# Patient Record
Sex: Male | Born: 1965 | Race: White | Hispanic: No | Marital: Single
Health system: Southern US, Community
[De-identification: ages and names within clinical notes are randomized; demographics above are authoritative.]

## PROBLEM LIST (undated history)

## (undated) HISTORY — PX: OTHER SURGICAL HISTORY: SHX169

## (undated) HISTORY — PX: CHOLECYSTECTOMY: SHX55

---

## 2015-11-20 ENCOUNTER — Other Ambulatory Visit (INDEPENDENT_AMBULATORY_CARE_PROVIDER_SITE_OTHER): Payer: Self-pay | Admitting: Internal Medicine

## 2015-11-20 DIAGNOSIS — K8050 Calculus of bile duct without cholangitis or cholecystitis without obstruction: Secondary | ICD-10-CM

## 2015-11-21 ENCOUNTER — Encounter (HOSPITAL_COMMUNITY)
Admission: RE | Disposition: A | Discharge status: Home / Self Care | Payer: Self-pay | Source: Ambulatory Visit | Attending: Internal Medicine

## 2015-11-21 ENCOUNTER — Ambulatory Visit (HOSPITAL_COMMUNITY): Payer: No Typology Code available for payment source | Admitting: Anesthesiology

## 2015-11-21 ENCOUNTER — Ambulatory Visit (HOSPITAL_COMMUNITY)
Admission: RE | Admit: 2015-11-21 | Discharge: 2015-11-21 | Disposition: A | Discharge status: Home / Self Care | Payer: No Typology Code available for payment source | Source: Ambulatory Visit | Attending: Internal Medicine | Admitting: Internal Medicine

## 2015-11-21 ENCOUNTER — Ambulatory Visit (HOSPITAL_COMMUNITY): Payer: No Typology Code available for payment source

## 2015-11-21 ENCOUNTER — Encounter (HOSPITAL_COMMUNITY): Payer: Self-pay | Admitting: *Deleted

## 2015-11-21 DIAGNOSIS — Z7982 Long term (current) use of aspirin: Secondary | ICD-10-CM | POA: Insufficient documentation

## 2015-11-21 DIAGNOSIS — K838 Other specified diseases of biliary tract: Secondary | ICD-10-CM | POA: Diagnosis not present

## 2015-11-21 DIAGNOSIS — Z9049 Acquired absence of other specified parts of digestive tract: Secondary | ICD-10-CM | POA: Diagnosis not present

## 2015-11-21 DIAGNOSIS — Z7951 Long term (current) use of inhaled steroids: Secondary | ICD-10-CM | POA: Insufficient documentation

## 2015-11-21 DIAGNOSIS — K8050 Calculus of bile duct without cholangitis or cholecystitis without obstruction: Secondary | ICD-10-CM | POA: Diagnosis not present

## 2015-11-21 HISTORY — PX: REMOVAL OF STONES: SHX5545

## 2015-11-21 HISTORY — PX: SPHINCTEROTOMY: SHX5544

## 2015-11-21 HISTORY — PX: ERCP: SHX5425

## 2015-11-21 LAB — HEPATIC FUNCTION PANEL
ALT: 26 U/L (ref 17–63)
AST: 23 U/L (ref 15–41)
Albumin: 3.1 g/dL — ABNORMAL LOW (ref 3.5–5.0)
Alkaline Phosphatase: 42 U/L (ref 38–126)
BILIRUBIN DIRECT: 0.2 mg/dL (ref 0.1–0.5)
Indirect Bilirubin: 0.7 mg/dL (ref 0.3–0.9)
Total Bilirubin: 0.9 mg/dL (ref 0.3–1.2)
Total Protein: 5.9 g/dL — ABNORMAL LOW (ref 6.5–8.1)

## 2015-11-21 SURGERY — ERCP, WITH INTERVENTION IF INDICATED
Anesthesia: General

## 2015-11-21 MED ORDER — SODIUM CHLORIDE 0.9 % IV SOLN
INTRAVENOUS | Status: AC
  Filled 2015-11-21: qty 50

## 2015-11-21 MED ORDER — FENTANYL CITRATE (PF) 100 MCG/2ML IJ SOLN: 25.0000 ug | INTRAMUSCULAR | Status: DC | PRN

## 2015-11-21 MED ORDER — ROCURONIUM BROMIDE 100 MG/10ML IV SOLN
INTRAVENOUS | Status: DC | PRN
  Administered 2015-11-21 (×2): 10 mg via INTRAVENOUS

## 2015-11-21 MED ORDER — GLUCAGON HCL RDNA (DIAGNOSTIC) 1 MG IJ SOLR
INTRAMUSCULAR | Status: AC
  Administered 2015-11-21 (×2): 0.25 mg via INTRAVENOUS
  Filled 2015-11-21: qty 2

## 2015-11-21 MED ORDER — SUCCINYLCHOLINE CHLORIDE 20 MG/ML IJ SOLN
INTRAMUSCULAR | Status: AC
  Filled 2015-11-21: qty 2

## 2015-11-21 MED ORDER — LEVOFLOXACIN IN D5W 500 MG/100ML IV SOLN
500.0000 mg | Freq: Once | INTRAVENOUS | Status: AC
  Administered 2015-11-21: 500 mg via INTRAVENOUS

## 2015-11-21 MED ORDER — SUCCINYLCHOLINE CHLORIDE 20 MG/ML IJ SOLN
INTRAMUSCULAR | Status: DC | PRN
  Administered 2015-11-21: 180 mg via INTRAVENOUS

## 2015-11-21 MED ORDER — GLYCOPYRROLATE 0.2 MG/ML IJ SOLN
INTRAMUSCULAR | Status: AC
  Filled 2015-11-21: qty 1

## 2015-11-21 MED ORDER — FENTANYL CITRATE (PF) 100 MCG/2ML IJ SOLN
INTRAMUSCULAR | Status: DC | PRN
  Administered 2015-11-21 (×2): 50 ug via INTRAVENOUS

## 2015-11-21 MED ORDER — PROPOFOL 10 MG/ML IV BOLUS
INTRAVENOUS | Status: AC
  Filled 2015-11-21: qty 20

## 2015-11-21 MED ORDER — MIDAZOLAM HCL 2 MG/2ML IJ SOLN
INTRAMUSCULAR | Status: AC
  Filled 2015-11-21: qty 2

## 2015-11-21 MED ORDER — ONDANSETRON HCL 4 MG/2ML IJ SOLN: 4.0000 mg | Freq: Once | INTRAMUSCULAR | Status: DC | PRN

## 2015-11-21 MED ORDER — STERILE WATER FOR IRRIGATION IR SOLN
Status: DC | PRN
  Administered 2015-11-21: 1000 mL

## 2015-11-21 MED ORDER — FENTANYL CITRATE (PF) 100 MCG/2ML IJ SOLN
INTRAMUSCULAR | Status: AC
  Filled 2015-11-21: qty 2

## 2015-11-21 MED ORDER — CEFAZOLIN SODIUM-DEXTROSE 2-4 GM/100ML-% IV SOLN
INTRAVENOUS | Status: AC
  Filled 2015-11-21: qty 100

## 2015-11-21 MED ORDER — NEOSTIGMINE METHYLSULFATE 10 MG/10ML IV SOLN
INTRAVENOUS | Status: DC | PRN
  Administered 2015-11-21: 4 mg via INTRAVENOUS

## 2015-11-21 MED ORDER — ROCURONIUM BROMIDE 50 MG/5ML IV SOLN
INTRAVENOUS | Status: AC
  Filled 2015-11-21: qty 1

## 2015-11-21 MED ORDER — PROPOFOL 10 MG/ML IV BOLUS
INTRAVENOUS | Status: DC | PRN
  Administered 2015-11-21: 180 mg via INTRAVENOUS

## 2015-11-21 MED ORDER — GLYCOPYRROLATE 0.2 MG/ML IJ SOLN
INTRAMUSCULAR | Status: DC | PRN
  Administered 2015-11-21: 0.6 mg via INTRAVENOUS

## 2015-11-21 MED ORDER — CEFAZOLIN SODIUM-DEXTROSE 2-4 GM/100ML-% IV SOLN
2.0000 g | INTRAVENOUS | Status: DC
  Filled 2015-11-21: qty 100

## 2015-11-21 MED ORDER — LIDOCAINE HCL (CARDIAC) 20 MG/ML IV SOLN
INTRAVENOUS | Status: DC | PRN
  Administered 2015-11-21: 50 mg via INTRAVENOUS

## 2015-11-21 MED ORDER — SODIUM CHLORIDE 0.9 % IV SOLN
INTRAVENOUS | Status: DC | PRN
  Administered 2015-11-21: 100 mL

## 2015-11-21 MED ORDER — LACTATED RINGERS IV SOLN
INTRAVENOUS | Status: DC
  Administered 2015-11-21: 13:00:00 via INTRAVENOUS
  Administered 2015-11-21: 1000 mL via INTRAVENOUS

## 2015-11-21 MED ORDER — MIDAZOLAM HCL 2 MG/2ML IJ SOLN
1.0000 mg | INTRAMUSCULAR | Status: DC | PRN
  Administered 2015-11-21 (×2): 2 mg via INTRAVENOUS
  Filled 2015-11-21: qty 2

## 2015-11-21 MED ORDER — GLYCOPYRROLATE 0.2 MG/ML IJ SOLN
0.2000 mg | Freq: Once | INTRAMUSCULAR | Status: AC
  Administered 2015-11-21: 0.2 mg via INTRAVENOUS

## 2015-11-21 MED ORDER — GLYCOPYRROLATE 0.2 MG/ML IJ SOLN
INTRAMUSCULAR | Status: AC
  Filled 2015-11-21: qty 5

## 2015-11-21 MED ORDER — LEVOFLOXACIN IN D5W 500 MG/100ML IV SOLN
INTRAVENOUS | Status: AC
  Filled 2015-11-21: qty 100

## 2015-11-21 MED ORDER — ONDANSETRON HCL 4 MG/2ML IJ SOLN
INTRAMUSCULAR | Status: AC
  Filled 2015-11-21: qty 2

## 2015-11-21 MED ORDER — ONDANSETRON HCL 4 MG/2ML IJ SOLN
4.0000 mg | Freq: Once | INTRAMUSCULAR | Status: AC
  Administered 2015-11-21: 4 mg via INTRAVENOUS

## 2015-11-21 NOTE — Progress Notes (Signed)
Blood drawn and sent to lab for results. 

## 2015-11-21 NOTE — Progress Notes (Signed)
Dr Karilyn Cotaehman notified of pt allergy to  PCN. Order changed to levaquin.

## 2015-11-21 NOTE — Anesthesia Postprocedure Evaluation (Signed)
Anesthesia Post Note  Patient: Barry Robinson  Procedure(s) Performed: Procedure(s) (LRB): ENDOSCOPIC RETROGRADE CHOLANGIOPANCREATOGRAPHY (ERCP) (N/A) BALLOON AND BASKET EXTRACTION OF STONES (N/A) SPHINCTEROTOMY (N/A)  Patient location during evaluation: PACU Anesthesia Type: General Level of consciousness: awake and alert Pain management: pain level controlled Vital Signs Assessment: vitals unstable Respiratory status: spontaneous breathing Cardiovascular status: stable Anesthetic complications: no    Last Vitals:  Filed Vitals:   11/21/15 1240 11/21/15 1245  BP: 143/56 130/63  Pulse:  90  Temp: 36.7 C   Resp: 14 13    Last Pain: There were no vitals filed for this visit.               Minerva AreolaYATES,Shatasia Cutshaw

## 2015-11-21 NOTE — Transfer of Care (Signed)
Immediate Anesthesia Transfer of Care Note  Patient: Barry LunaEdward Robinson  Procedure(s) Performed: Procedure(s): ENDOSCOPIC RETROGRADE CHOLANGIOPANCREATOGRAPHY (ERCP) (N/A) BALLOON AND BASKET EXTRACTION OF STONES (N/A) SPHINCTEROTOMY (N/A)  Patient Location: PACU  Anesthesia Type:General  Level of Consciousness: awake and patient cooperative  Airway & Oxygen Therapy: Patient Spontanous Breathing  Post-op Assessment: Report given to RN, Post -op Vital signs reviewed and stable and Patient moving all extremities  Post vital signs: Reviewed and stable  Last Vitals:  Filed Vitals:   11/21/15 1115 11/21/15 1120  BP: 113/56 119/59  Temp:    Resp: 17 13    Last Pain: There were no vitals filed for this visit.       Complications: No apparent anesthesia complications

## 2015-11-21 NOTE — Anesthesia Procedure Notes (Signed)
Procedure Name: Intubation Date/Time: 11/21/2015 11:35 AM Performed by: Pernell DupreADAMS, Barry Dugger A Pre-anesthesia Checklist: Patient identified, Patient being monitored, Timeout performed, Emergency Drugs available and Suction available Patient Re-evaluated:Patient Re-evaluated prior to inductionOxygen Delivery Method: Circle System Utilized Preoxygenation: Pre-oxygenation with 100% oxygen Intubation Type: IV induction, Rapid sequence and Cricoid Pressure applied Laryngoscope Size: 3 and Miller Grade View: Grade I Tube type: Oral Tube size: 7.0 mm Number of attempts: 1 Airway Equipment and Method: Stylet Placement Confirmation: ETT inserted through vocal cords under direct vision,  positive ETCO2 and breath sounds checked- equal and bilateral Secured at: 21 cm Tube secured with: Tape Dental Injury: Teeth and Oropharynx as per pre-operative assessment

## 2015-11-21 NOTE — Anesthesia Preprocedure Evaluation (Signed)
Anesthesia Evaluation  Patient identified by MRN, date of birth, ID band Patient awake    Reviewed: Allergy & Precautions, NPO status , Patient's Chart, lab work & pertinent test results  Airway Mallampati: I  TM Distance: >3 FB     Dental  (+) Teeth Intact, Dental Advisory Given   Pulmonary neg pulmonary ROS,    breath sounds clear to auscultation       Cardiovascular negative cardio ROS   Rhythm:Regular Rate:Normal     Neuro/Psych    GI/Hepatic negative GI ROS,   Endo/Other  Morbid obesity  Renal/GU      Musculoskeletal   Abdominal   Peds  Hematology   Anesthesia Other Findings   Reproductive/Obstetrics                             Anesthesia Physical Anesthesia Plan  ASA: II  Anesthesia Plan: General   Post-op Pain Management:    Induction: Intravenous, Rapid sequence and Cricoid pressure planned  Airway Management Planned: Oral ETT  Additional Equipment:   Intra-op Plan:   Post-operative Plan: Extubation in OR  Informed Consent: I have reviewed the patients History and Physical, chart, labs and discussed the procedure including the risks, benefits and alternatives for the proposed anesthesia with the patient or authorized representative who has indicated his/her understanding and acceptance.     Plan Discussed with:   Anesthesia Plan Comments:         Anesthesia Quick Evaluation

## 2015-11-21 NOTE — H&P (Signed)
Barry Robinson is an 50 y.o. male.   Chief Complaint: Patient is here for ERCP with stone extraction HPI: Asian is 50 year old Caucasian male who underwent laparoscopic cholecystectomy by Dr. Marcha Soldersathey 2 days ago. JP drain was left in place. There was concern for choledocholithiasis. Patient underwent MRCP S phase revealed 3 stones in CBD. Patient is therefore referred for therapeutic ERCP. Patient denies fever chills nausea or vomiting. Soreness around laparoscopy wound site and gas pain. He had diarrhea yesterday. Denies melena or rectal bleeding. Last aspirin dose was 4 days ago.  No past medical history on file.  Past Surgical History  Procedure Laterality Date  . Cholecystectomy    . Lithotripsy x4 Bilateral last one 04/2014    No family history on file. Social History:  has no tobacco, alcohol, and drug history on file.  Allergies:  Allergies  Allergen Reactions  . Penicillins Hives    Has patient had a PCN reaction causing immediate rash, facial/tongue/throat swelling, SOB or lightheadedness with hypotension: Nono Has patient had a PCN reaction causing severe rash involving mucus membranes or skin necrosis: Nono just hives Has patient had a PCN reaction that required hospitalization Nono Has patient had a PCN reaction occurring within the last 10 years: Barry DibblesYesyes If all of the above answers are "NO", then may proceed with Ce  . Rocephin [Ceftriaxone] Hives    Medications Prior to Admission  Medication Sig Dispense Refill  . aspirin 325 MG tablet Take 325 mg by mouth daily.    . cetirizine (ZYRTEC) 10 MG tablet Take 10 mg by mouth daily.    Marland Kitchen. CRANBERRY PO Take 1 tablet by mouth daily.    . fluticasone (FLONASE) 50 MCG/ACT nasal spray Place into both nostrils daily.    . Multiple Vitamin (MULTIVITAMIN) tablet Take 1 tablet by mouth daily.    . Omega-3 Fatty Acids (FISH OIL) 1200 MG CAPS Take 2 capsules by mouth daily.    . Probiotic Product (ALIGN PO) Take 1 capsule by mouth  daily.    . vitamin A 7500 UNIT capsule Take 7,500 Units by mouth daily.    . vitamin C (ASCORBIC ACID) 500 MG tablet Take 500 mg by mouth daily.      Results for orders placed or performed during the hospital encounter of 11/21/15 (from the past 48 hour(s))  Hepatic function panel     Status: Abnormal   Collection Time: 11/21/15  8:15 AM  Result Value Ref Range   Total Protein 5.9 (L) 6.5 - 8.1 g/dL   Albumin 3.1 (L) 3.5 - 5.0 g/dL   AST 23 15 - 41 U/L   ALT 26 17 - 63 U/L   Alkaline Phosphatase 42 38 - 126 U/L   Total Bilirubin 0.9 0.3 - 1.2 mg/dL   Bilirubin, Direct 0.2 0.1 - 0.5 mg/dL   Indirect Bilirubin 0.7 0.3 - 0.9 mg/dL   No results found.  ROS  Blood pressure 125/50, temperature 98.8 F (37.1 C), resp. rate 16, height 5\' 9"  (1.753 m), weight 295 lb (133.811 kg), SpO2 98 %. Physical Exam  Constitutional:  Pleasant well-developed obese Caucasian male in NAD.  HENT:  Mouth/Throat: Oropharynx is clear and moist.  Eyes: Conjunctivae are normal. No scleral icterus.  Neck: No thyromegaly present.  Cardiovascular: Normal rate, regular rhythm and normal heart sounds.   No murmur heard. Respiratory: Effort normal and breath sounds normal.  GI:  Abdomen is full with laparoscopies wounds covered with dressing. JP drain in place with scant amount  of serous fluid in the container. Abdomen is soft and nontender without organomegaly or masses.  Lymphadenopathy:    He has no cervical adenopathy.     Assessment/Plan Choledocholithiasis and the patient was status post laparoscopic cholecystectomy 2 days ago for acute cholecystitis. Transaminases are normal. ERCP with biliary sphincterotomy and stone extraction. Procedure and risks reviewed with the patient and is agreeable. Patient will be returning to [facility] once ERCP performed.  Malissa Hippo, MD 11/21/2015, 10:19 AM

## 2015-11-21 NOTE — Op Note (Signed)
[facility] Patient Name: Barry Robinson Procedure Date: 11/21/2015 11:28 AM MRN: [id number] Date of Birth: 03/04/1966 Attending MD: Lionel December , MD CSN: [id number] Age: 50 Admit Type: Outpatient Procedure:                ERCP Indications:              Bile duct stone(s), For therapy of bile duct                            stone(s) Providers:                Lionel December, MD, Edrick Kins, RN, Birder Robson, Technician Referring MD:             Alice Reichert, MD Medicines:                General Anesthesia Complications:            No immediate complications. Estimated Blood Loss:     Estimated blood loss: none. Procedure:                Pre-Anesthesia Assessment:                           - Prior to the procedure, a History and Physical                            was performed, and patient medications and                            allergies were reviewed. The patient's tolerance of                            previous anesthesia was also reviewed. The risks                            and benefits of the procedure and the sedation                            options and risks were discussed with the patient.                            All questions were answered, and informed consent                            was obtained. Prior Anticoagulants: The patient                            last took aspirin 4 days prior to the procedure.                            ASA Grade Assessment: II - A patient with mild  systemic disease. After reviewing the risks and                            benefits, the patient was deemed in satisfactory                            condition to undergo the procedure.                           After obtaining informed consent, the scope was                            passed under direct vision. Throughout the                            procedure, the patient's blood pressure, pulse, and                       oxygen saturations were monitored continuously. The                            [id number] ([id number]) was introduced through the                            mouth, and used to inject contrast into and used to                            inject contrast into the bile duct. The ERCP was                            accomplished without difficulty. The patient                            tolerated the procedure well. Scope In: 11:55:12 AM Scope Out: 12:24:16 PM Total Procedure Duration: 0 hours 29 minutes 4 seconds  Findings:      The scout film was normal. The esophagus was successfully intubated       under direct vision. The scope was advanced to a normal major papilla in       the descending duodenum without detailed examination of the pharynx,       larynx and associated structures, and upper GI tract. The upper GI tract       was grossly normal. The bile duct was deeply cannulated with the       short-nosed traction autotome with guidewire. Contrast was injected. I       personally interpreted the bile duct images. Ductal flow of contrast was       adequate. Image quality was excellent. Contrast extended to the entire       biliary tree. Choledocholithiasis was found in a nondilated duct. The       common bile duct and common hepatic duct were mildly dilated and       diffusely dilated. The largest diameter was 8 mm. The lower third of the       main bile duct contained three stones mm. The biliary tree was otherwise       normal. A 6  mm biliary sphincterotomy was made with a traction       (standard) sphincterotome using ERBE electrocautery. There was no       post-sphincterotomy bleeding. Choledocholithiasis was found in a       nondilated duct. The biliary tree was swept with a 10 mm balloon and       basket starting at the upper third of the main bile duct, middle third       of the main bile duct and lower third of the main duct. Three stones       were removed. Three  stones remained. Impression:               - The common bile duct and common hepatic duct were                            mildly dilated.                           - A biliary sphincterotomy was performed.                           - Choledocholithiasis was found. Complete removal                            was accomplished by biliary sphincterotomy and                            balloon/basket extraction.                           - Bile duct cleared of all stones. Moderate Sedation:      Per Anesthesia Care Recommendation:           - Avoid aspirin and nonsteroidal anti-inflammatory                            medicines for 3 days.                           - Clear liquid diet today.                           - Return patient to referring hospital for ongoing                            care. Procedure Code(s):        --- Professional ---                           [phone], Endoscopic retrograde                            cholangiopancreatography (ERCP); with removal of                            calculi/debris from biliary/pancreatic duct(s)                           43262,  Endoscopic retrograde                            cholangiopancreatography (ERCP); with                            sphincterotomy/papillotomy Diagnosis Code(s):        --- Professional ---                           K80.50, Calculus of bile duct without cholangitis                            or cholecystitis without obstruction                           K83.8, Other specified diseases of biliary tract CPT copyright 2016 [employer]. All rights reserved. The codes documented in this report are preliminary and upon coder review may  be revised to meet current compliance requirements. Lionel December, MD Lionel December, MD 11/21/2015 12:43:50 PM This report has been signed electronically. Number of Addenda: 0

## 2015-11-21 NOTE — Discharge Instructions (Signed)
Clear liquids today. No aspirin, NSAIDs and anti-coagulnt for three days.

## 2015-11-22 ENCOUNTER — Encounter (HOSPITAL_COMMUNITY): Payer: Self-pay | Admitting: Internal Medicine

## 2015-11-22 NOTE — Addendum Note (Signed)
Addendum  created 11/22/15 0746 by Earleen NewportAmy A Paiden Cavell, CRNA   Modules edited: Anesthesia Medication Administration

## 2017-03-20 IMAGING — RF DG ERCP WO/W SPHINCTEROTOMY
1 series · 11 of 11 positions shown · non-contrast
Comparison: None.

CLINICAL DATA: Choledocholithiasis.

EXAM:
ERCP
TECHNIQUE: Multiple spot images obtained with the fluoroscopic device and
submitted for interpretation post-procedure.

[Series 1: run · 11 of 11 slices shown]
[im 1/11]
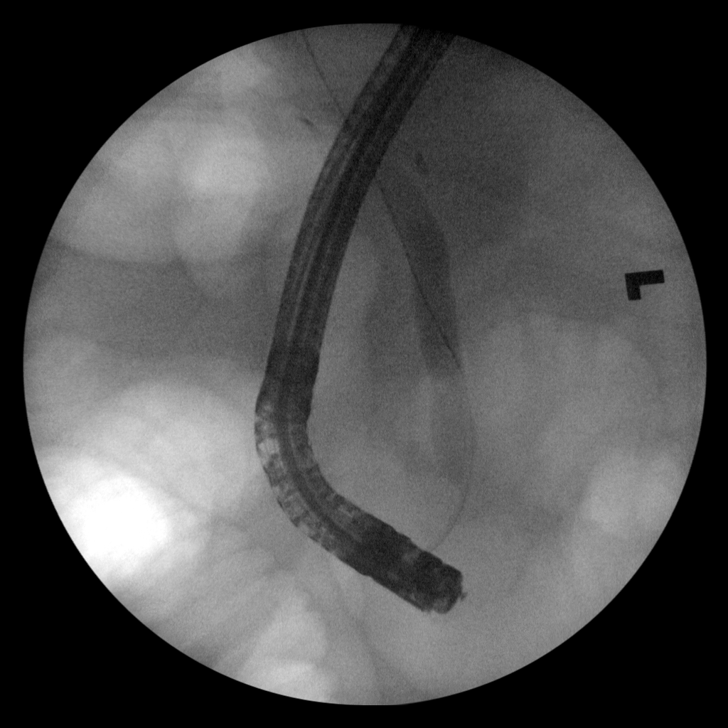
[im 2/11]
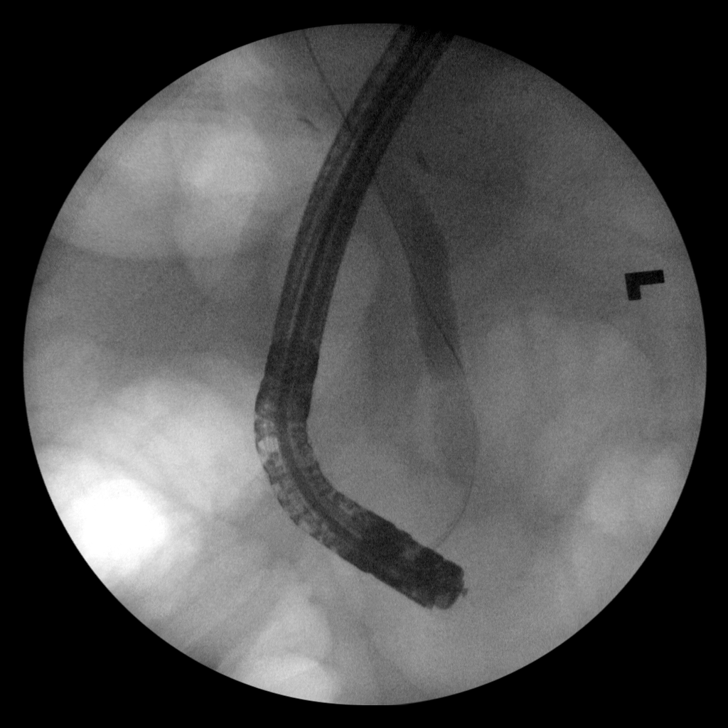
[im 3/11]
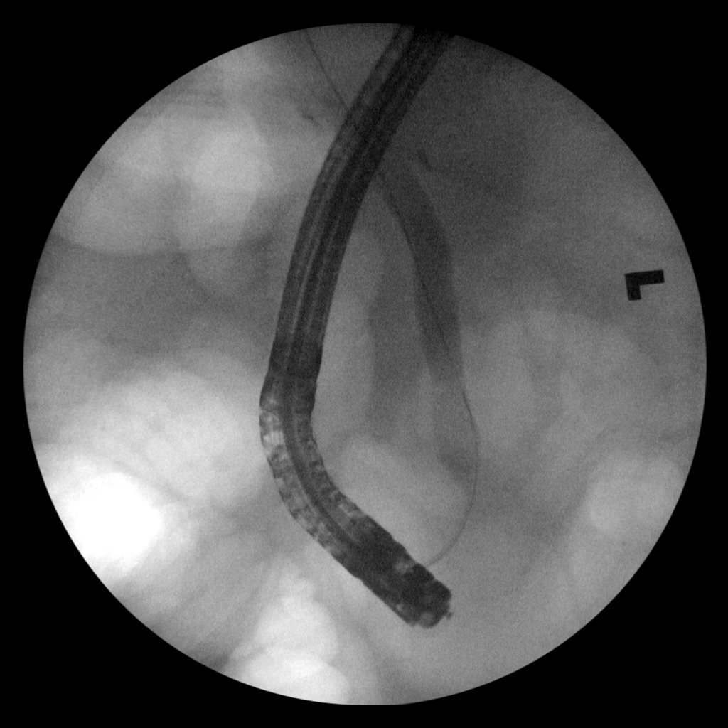
[im 4/11]
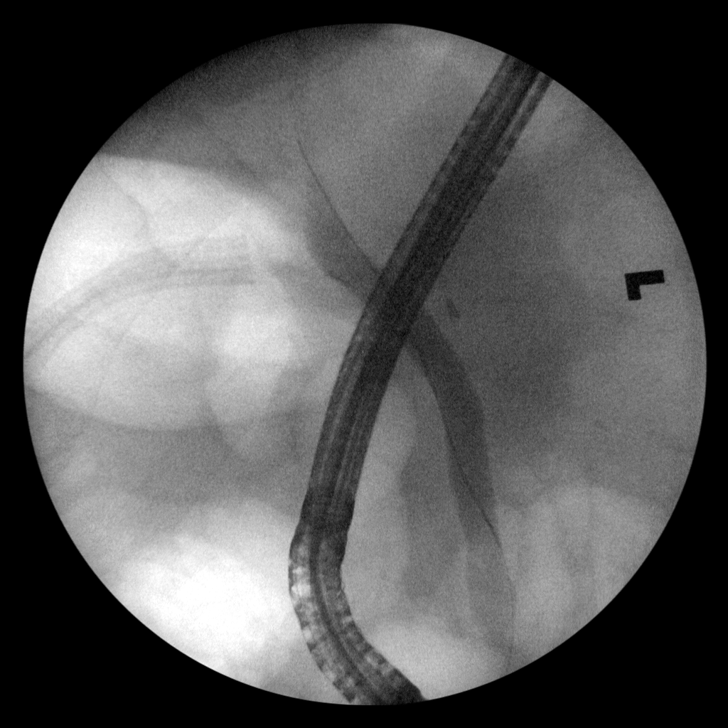
[im 5/11]
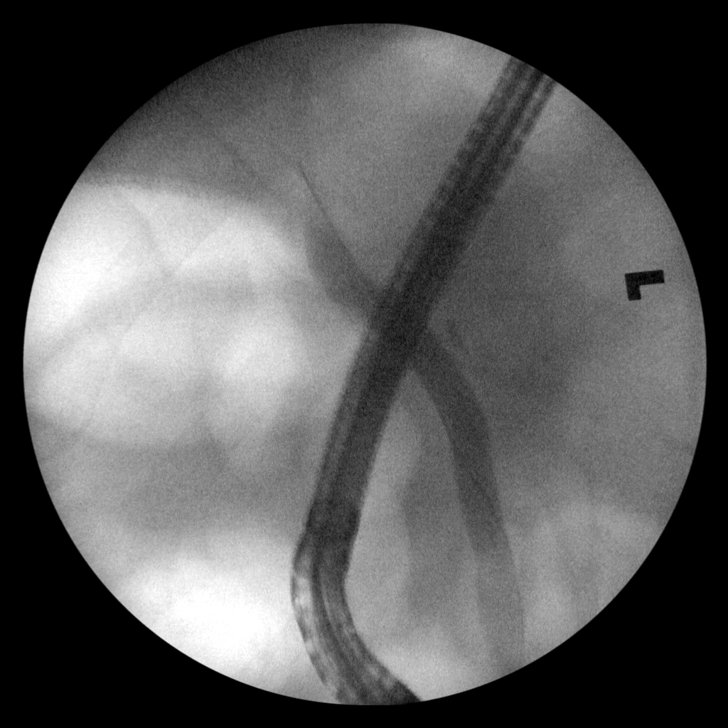
[im 6/11]
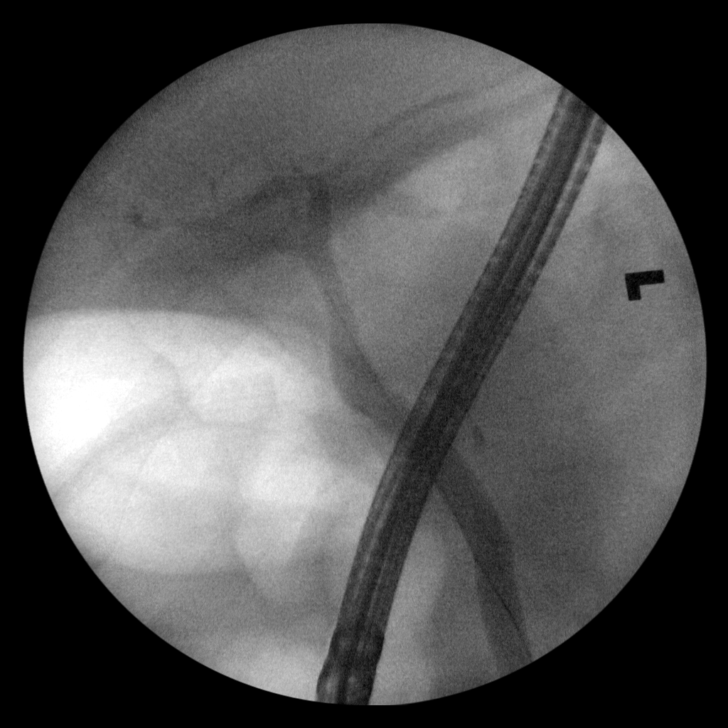
[im 7/11]
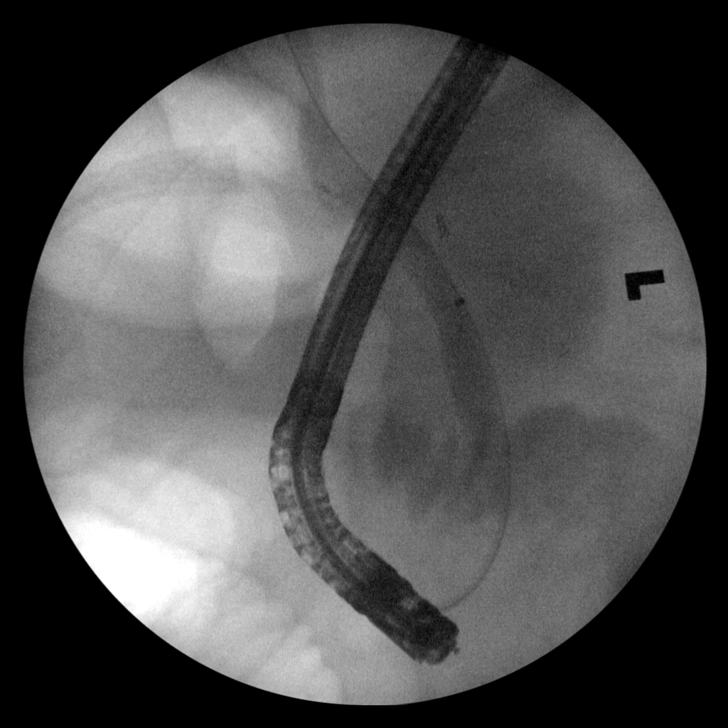
[im 8/11]
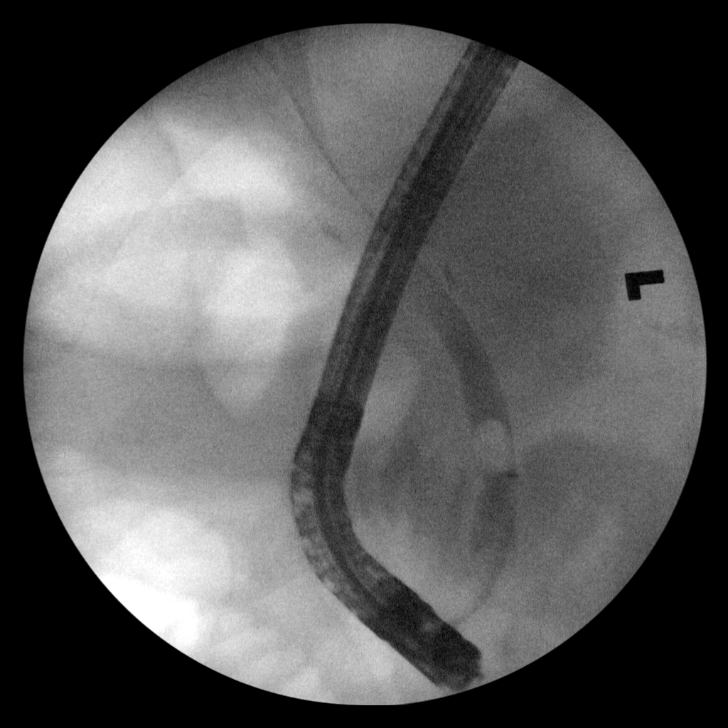
[im 9/11]
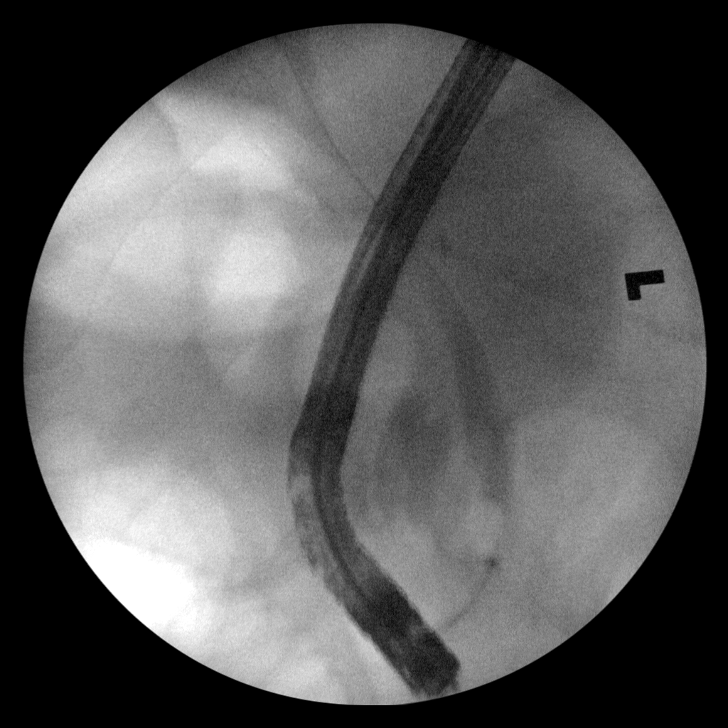
[im 10/11]
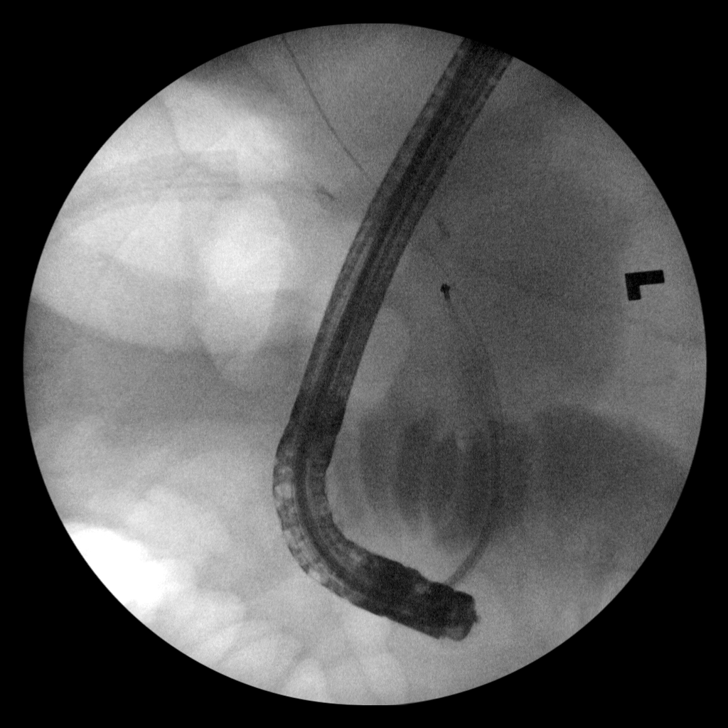
[im 11/11]
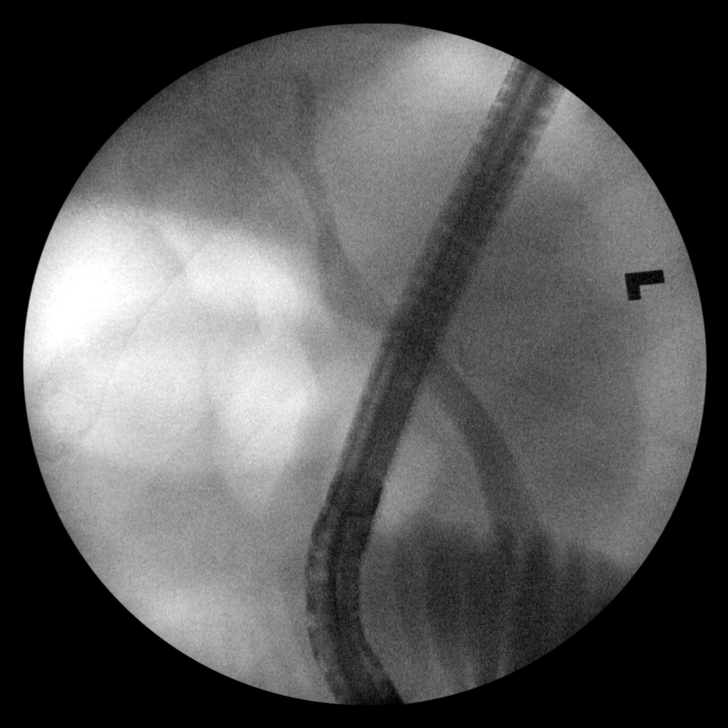

[11 of 11 positions shown; findings below may reference images not displayed]

FINDINGS: Imaging during ERCP demonstrates cannulation of the common bile duct
with contrast injection demonstrating filling defects in the distal
CBD. Balloon sweep maneuver and basket retrieval was performed to
remove calculi. Completion cholangiogram shows no visible
significant retained calculi.
IMPRESSION: ERCP demonstrating choledocholithiasis with balloon sweep maneuver
and basket retrieval performed to remove CBD calculi.

These images were submitted for radiologic interpretation only.
Please see the procedural report for the amount of contrast and the
fluoroscopy time utilized.
